# Patient Record
Sex: Male | Born: 2000 | Race: White | Hispanic: No | Marital: Single | Smoking: Never smoker
Health system: Southern US, Community
[De-identification: ages and names within clinical notes are randomized; demographics above are authoritative.]

## PROBLEM LIST (undated history)

## (undated) HISTORY — PX: OTHER SURGICAL HISTORY: SHX169

## (undated) HISTORY — PX: WISDOM TOOTH EXTRACTION: SHX21

---
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
## 2021-02-07 ENCOUNTER — Ambulatory Visit: Payer: Medicaid Other | Admitting: Dermatology

## 2022-01-08 ENCOUNTER — Ambulatory Visit (INDEPENDENT_AMBULATORY_CARE_PROVIDER_SITE_OTHER): Payer: Medicaid Other | Admitting: Internal Medicine

## 2022-01-08 ENCOUNTER — Encounter: Payer: Self-pay | Admitting: Internal Medicine

## 2022-01-08 DIAGNOSIS — F845 Asperger's syndrome: Secondary | ICD-10-CM | POA: Diagnosis not present

## 2022-01-08 DIAGNOSIS — K219 Gastro-esophageal reflux disease without esophagitis: Secondary | ICD-10-CM

## 2022-01-08 DIAGNOSIS — F419 Anxiety disorder, unspecified: Secondary | ICD-10-CM

## 2022-01-08 DIAGNOSIS — F5104 Psychophysiologic insomnia: Secondary | ICD-10-CM | POA: Diagnosis not present

## 2022-01-08 DIAGNOSIS — F902 Attention-deficit hyperactivity disorder, combined type: Secondary | ICD-10-CM

## 2022-01-08 DIAGNOSIS — G4700 Insomnia, unspecified: Secondary | ICD-10-CM | POA: Insufficient documentation

## 2022-01-08 DIAGNOSIS — F32A Depression, unspecified: Secondary | ICD-10-CM

## 2022-01-08 NOTE — Patient Instructions (Signed)
Attention Deficit Hyperactivity Disorder, Adult Attention deficit hyperactivity disorder (ADHD) is a mental health disorder that starts during childhood. For many people with ADHD, the disorder continues into the adult years. Treatment can help you manage your symptoms. There are three main types of ADHD: Inattentive. With this type, adults have difficulty paying attention. This may affect cognitive abilities. Hyperactive-impulsive. With this type, adults have a lot of energy and have difficulty controlling their behavior. Combination type. Some people may have symptoms of both types. What are the causes? The exact cause of ADHD is not known. Most experts believe a person's genes and environment possibly contribute to ADHD. What increases the risk? The following factors may make you more likely to develop this condition: Having a first-degree relative such as a parent, brother, or sister, with the condition. Being born before 37 weeks of pregnancy (prematurely) or at a low birth weight. Being born to a mother who smoked tobacco or drank alcohol during pregnancy. Having experienced a brain injury. Being exposed to lead or other toxins in the womb or early in life. What are the signs or symptoms? Symptoms of this condition depend on the type of ADHD. Symptoms of the inattentive type include: Difficulty paying attention or following instructions. Often making simple mistakes. Being disorganized. Avoiding tasks that require time and attention. Losing and forgetting things. Symptoms of the hyperactive-impulsive type include: Restlessness. Talking out of turn, interrupting others, or talking too much. Difficulty with: Sitting still. Feeling motivated. Relaxing. Waiting in line or waiting for a turn. People with the combination type have symptoms of both of the other types. In adults, this condition may lead to certain problems, such as: Keeping jobs. Performing tasks at work. Having  stable relationships. Being on time or keeping to a schedule. How is this diagnosed? This condition is diagnosed based on your current symptoms and your history of symptoms. The diagnosis can be made by a health care provider such as a primary care provider or a mental health care specialist. Your health care provider may use a symptom checklist or a behavior rating scale to evaluate your symptoms. Your health care provider may also want to talk with people who have observed your behaviors throughout your life. How is this treated? This condition can be treated with medicines and behavior therapy. Medicines may be the best option to reduce impulsive behaviors and improve attention. Your health care provider may recommend: Stimulant medicines. These are the most common medicines used for adult ADHD. They affect certain chemicals in the brain (neurotransmitters) and improve your ability to control your symptoms. A non-stimulant medicine. These medicines can also improve focus, attention, and impulsive behavior. It may take weeks to months to see the effects of this medicine. Counseling and behavioral management are also important for treating ADHD. Counseling is often used along with medicine. Your health care provider may suggest: Cognitive behavioral therapy (CBT). This type of therapy teaches you to replace negative thoughts and actions with positive thoughts and actions. When used as part of ADHD treatment, this therapy may also include: Coping strategies for organization, time management, impulse control, and stress reduction. Mindfulness and meditation training. Behavioral management. You may work with a coach who is specially trained to help people with ADHD manage and organize activities and function more effectively. Follow these instructions at home: Medicines  Take over-the-counter and prescription medicines only as told by your health care provider. Talk with your health care provider  about the possible side effects of your medicines and   how to manage them. Alcohol use Do not drink alcohol if: Your health care provider tells you not to drink. You are pregnant, may be pregnant, or are planning to become pregnant. If you drink alcohol: Limit how much you use to: 0-1 drink a day for women. 0-2 drinks a day for men. Know how much alcohol is in your drink. In the [place] one drink equals one 12 oz bottle of beer (355 mL), one 5 oz glass of wine (148 mL), or one 1 oz glass of hard liquor (44 mL). Lifestyle  Do not use illegal drugs. Get enough sleep. Eat a healthy diet. Exercise regularly. Exercise can help to reduce stress and anxiety. General instructions Learn as much as you can about adult ADHD, and work closely with your health care providers to find the treatments that work best for you. Follow the same schedule each day. Use reminder devices like notes, calendars, and phone apps to stay on time and organized. Keep all follow-up visits. Your health care provider will need to monitor your condition and adjust your treatment over time. Where to find more information A health care provider may be able to recommend resources that are available online or over the phone. You could start with: Attention Deficit Disorder Association (ADDA): add.org National Institute of Mental Health (NIMH): nimh.nih.gov Contact a health care provider if: Your symptoms continue to cause problems. You have side effects from your medicine, such as: Repeated muscle twitches, coughing, or speech outbursts. Sleep problems. Loss of appetite. Dizziness. Unusually fast heartbeat. Stomach pains. Headaches. You are struggling with anxiety, depression, or substance abuse. Get help right away if: You have a severe reaction to a medicine. This symptom may be an emergency. Get help right away. Call 911. Do not wait to see if the symptom will go away. Do not drive yourself to the hospital. Take  one of these steps if you feel like you may hurt yourself or others, or have thoughts about taking your own life: Go to your nearest emergency room. Call 911. Call the National Suicide Prevention Lifeline at 1-800-273-8255 or 988. This is open 24 hours a day Text the Crisis Text Line at 741741. Summary ADHD is a mental health disorder that starts during childhood and often continues into your adult years. The exact cause of ADHD is not known. Most experts believe genetics and environmental factors contribute to ADHD. There is no cure for ADHD, but treatment with medicine, cognitive behavioral therapy, or behavioral management can help you manage your condition. This information is not intended to replace advice given to you by your health care provider. Make sure you discuss any questions you have with your health care provider. Document Revised: 07/14/2021 Document Reviewed: 07/14/2021 Elsevier Patient Education  2023 Elsevier Inc.  

## 2022-01-08 NOTE — Assessment & Plan Note (Signed)
Continue Mirtazapine and Clonidine, managed by psychiatry, support offered

## 2022-01-08 NOTE — Assessment & Plan Note (Signed)
Continue clonidine and mirtazapine as prescribed by psychiatry

## 2022-01-08 NOTE — Assessment & Plan Note (Signed)
Continue Famotidine as needed 

## 2022-01-08 NOTE — Assessment & Plan Note (Signed)
Not medicated, follows with psychiatry

## 2022-01-08 NOTE — Progress Notes (Signed)
HPI  Patient presents to clinic today to establish care.  ADHD: He reports he grew out of this. He is not currently taking any medication for this. He is seeing Neuropsychiatry in [place].  GERD: He is not sure what triggers this. He takes Famotidine as needed. T here is no upper GI on file.  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 He is about to start a new job on Monday at [employer]. He is able to drive.  Insomnia: He has difficulty falling asleep. He takes Clonidine and Mirtazapine. He follows with psychiatry.  Anxiety and Depression: Managed with Clonidine and Mirtazapine. He is not currently seeing a therapist but is seeing psychiatry. He denies SI/HI.  Past Medical History:  Diagnosis Date   ADHD (attention deficit hyperactivity disorder), combined type 08/11/2014    Current Outpatient Medications  Medication Sig Dispense Refill   cetirizine (ZYRTEC) 10 MG tablet Take by mouth.     cloNIDine (CATAPRES) 0.2 MG tablet Take by mouth.     famotidine (PEPCID) 20 MG tablet Take 1 tablet (20 mg total) by mouth daily. 30 tablet 1   lidocaine (XYLOCAINE) 2 % solution Use as directed 5 mLs in the mouth or throat every 6 (six) hours as needed for mouth pain. Oral swish and swallow 4 times a day for 5 days for meals. 100 mL 0   mirtazapine (REMERON) 15 MG tablet Take by mouth.     No current facility-administered medications for this visit.    Allergies  Allergen Reactions   Abilify [Aripiprazole] Shortness Of Breath and Swelling    Family History  Problem Relation Age of Onset   Bladder Cancer Neg Hx    Kidney disease Neg Hx    Prostate cancer Neg Hx     Social History   Socioeconomic History   Marital status: Single    Spouse name: Not on file   Number of children: Not on file   Years of education: Not on file   Highest education level: Not on file  Occupational History   Not on file  Tobacco Use   Smoking status: Never   Smokeless  tobacco: Never  Substance and Sexual Activity   Alcohol use: Never   Drug use: Not on file   Sexual activity: Not on file  Other Topics Concern   Not on file  Social History Narrative   Not on file   Social Determinants of Health   Financial Resource Strain: Not on file  Food Insecurity: Not on file  Transportation Needs: Not on file  Physical Activity: Not on file  Stress: Not on file  Social Connections: Not on file  Intimate Partner Violence: Not on file    ROS:  Constitutional: Denies fever, malaise, fatigue, headache or abrupt weight changes.  HEENT: Denies eye pain, eye redness, ear pain, ringing in the ears, wax buildup, runny nose, nasal congestion, bloody nose, or sore throat. Respiratory: Denies difficulty breathing, shortness of breath, cough or sputum production.   Cardiovascular: Denies chest pain, chest tightness, palpitations or swelling in the hands or feet.  Gastrointestinal: Pt reports intermittent reflux. Denies abdominal pain, bloating, constipation, diarrhea or blood in the stool.  GU: Denies frequency, urgency, pain with urination, blood in urine, odor or discharge. Musculoskeletal: Denies decrease in range of motion, difficulty with gait, muscle pain or joint pain and swelling.  Skin: Denies redness, rashes, lesions or ulcercations.  Neurological: Pt reports insomnia. Denies dizziness, difficulty with memory, difficulty  with speech or problems with balance and coordination.  Psych: Pt has a history of anxiety and depression. Denies SI/HI.  No other specific complaints in a complete review of systems (except as listed in HPI above).  PE: BP 128/84 (BP Location: Left Arm, Patient Position: Sitting, Cuff Size: Normal)   Pulse 65   Temp (!) 97.1 F (36.2 C) (Temporal)   Ht 5' 10.5" (1.791 m)   Wt 131 lb (59.4 kg)   SpO2 96%   BMI 18.53 kg/m   Wt Readings from Last 3 Encounters:  11/10/19 120 lb (54.4 kg) (4 %, Z= -1.78)*  06/26/19 120 lb (54.4 kg)  (4 %, Z= -1.72)*   * Growth percentiles are based on CDC (Boys, 2-20 Years) data.    General: Appears his stated age, well developed, well nourished in NAD. HEENT: Head: normal shape and size; Eyes: sclera white, no icterus, conjunctiva pink, PERRLA and EOMs intact;  Neck: Neck supple, trachea midline. No masses, lumps or thyromegaly present.  Cardiovascular: Normal rate and rhythm. S1,S2 noted.  No murmur, rubs or gallops noted. No JVD or BLE edema. Pulmonary/Chest: Normal effort and positive vesicular breath sounds. No respiratory distress. No wheezes, rales or ronchi noted.  Abdomen: Normal bowel sounds. Musculoskeletal: No difficulty with gait.  Neurological: Alert and oriented.  Psychiatric: Judgment and thought content normal.     Assessment and Plan:  RTC in 6 months for your annual exam Nicki Reaper, NP

## 2022-01-08 NOTE — Assessment & Plan Note (Signed)
He is trying to be independent

## 2022-07-10 ENCOUNTER — Ambulatory Visit: Payer: Medicaid Other | Admitting: Internal Medicine

## 2022-07-16 ENCOUNTER — Ambulatory Visit (INDEPENDENT_AMBULATORY_CARE_PROVIDER_SITE_OTHER): Payer: Medicaid Other | Admitting: Internal Medicine

## 2022-07-16 ENCOUNTER — Encounter: Payer: Self-pay | Admitting: Internal Medicine

## 2022-07-16 VITALS — BP 114/62 | HR 95 | Temp 97.1°F | Ht 70.5 in | Wt 138.0 lb

## 2022-07-16 DIAGNOSIS — Z23 Encounter for immunization: Secondary | ICD-10-CM | POA: Diagnosis not present

## 2022-07-16 DIAGNOSIS — Z0001 Encounter for general adult medical examination with abnormal findings: Secondary | ICD-10-CM | POA: Diagnosis not present

## 2022-07-16 NOTE — Patient Instructions (Signed)
Preventive Care 18-21 Years Old, Male ?Preventive care refers to lifestyle choices and visits with your health care provider that can promote health and wellness. At this stage in your life, you may start seeing a primary care physician instead of a pediatrician for your preventive care. Preventive care visits are also called wellness exams. ?What can I expect for my preventive care visit? ?Counseling ?During your preventive care visit, your health care provider may ask about your: ?Medical history, including: ?Past medical problems. ?Family medical history. ?Current health, including: ?Home life and relationship well-being. ?Emotional well-being. ?Sexual activity and sexual health. ?Lifestyle, including: ?Alcohol, nicotine or tobacco, and drug use. ?Access to firearms. ?Diet, exercise, and sleep habits. ?Sunscreen use. ?Motor vehicle safety. ?Physical exam ?Your health care provider may check your: ?Height and weight. These may be used to calculate your BMI (body mass index). BMI is a measurement that tells if you are at a healthy weight. ?Waist circumference. This measures the distance around your waistline. This measurement also tells if you are at a healthy weight and may help predict your risk of certain diseases, such as type 2 diabetes and high blood pressure. ?Heart rate and blood pressure. ?Body temperature. ?Skin for abnormal spots. ?What immunizations do I need? ? ?Vaccines are usually given at various ages, according to a schedule. Your health care provider will recommend vaccines for you based on your age, medical history, and lifestyle or other factors, such as travel or where you work. ?What tests do I need? ?Screening ?Your health care provider may recommend screening tests for certain conditions. This may include: ?Vision and hearing tests. ?Lipid and cholesterol levels. ?Hepatitis B test. ?Hepatitis C test. ?HIV (human immunodeficiency virus) test. ?STI (sexually transmitted infection) testing, if  you are at risk. ?Tuberculosis skin test. ?Talk with your health care provider about your test results, treatment options, and if necessary, the need for more tests. ?Follow these instructions at home: ?Eating and drinking ? ?Eat a healthy diet that includes fresh fruits and vegetables, whole grains, lean protein, and low-fat dairy products. ?Drink enough fluid to keep your urine pale yellow. ?Do not drink alcohol if: ?Your health care provider tells you not to drink. ?You are under the legal drinking age. In the [place] the legal drinking age is 21. ?If you drink alcohol: ?Limit how much you have to 0-2 drinks a day. ?Know how much alcohol is in your drink. In the [place] one drink equals one 12 oz bottle of beer (355 mL), one 5 oz glass of wine (148 mL), or one 1? oz glass of hard liquor (44 mL). ?Lifestyle ?Brush your teeth every morning and night with fluoride toothpaste. Floss one time each day. ?Exercise for at least 30 minutes 5 or more days of the week. ?Do not use any products that contain nicotine or tobacco. These products include cigarettes, chewing tobacco, and vaping devices, such as e-cigarettes. If you need help quitting, ask your health care provider. ?Do not use drugs. ?If you are sexually active, practice safe sex. Use a condom or other form of protection to prevent STIs. ?Find healthy ways to manage stress, such as: ?Meditation, yoga, or listening to music. ?Journaling. ?Talking to a trusted person. ?Spending time with friends and family. ?Safety ?Always wear your seat belt while driving or riding in a vehicle. ?Do not drive: ?If you have been drinking alcohol. Do not ride with someone who has been drinking. ?When you are tired or distracted. ?While texting. ?If you have been using   any mind-altering substances or drugs. ?Wear a helmet and other protective equipment during sports activities. ?If you have firearms in your house, make sure you follow all gun safety procedures. ?Seek help if you have  been bullied, physically abused, or sexually abused. ?Use the internet responsibly to avoid dangers, such as online bullying and online sex predators. ?What's next? ?Go to your health care provider once a year for an annual wellness visit. ?Ask your health care provider how often you should have your eyes and teeth checked. ?Stay up to date on all vaccines. ?This information is not intended to replace advice given to you by your health care provider. Make sure you discuss any questions you have with your health care provider. ?Document Revised: 09/21/2020 Document Reviewed: 09/21/2020 ?Elsevier Patient Education ? 2023 [employer] ? ?

## 2022-07-16 NOTE — Progress Notes (Unsigned)
Subjective:    Patient ID: Mark Gill, male    DOB: September 20, 2000, 22 y.o.   MRN: [id number]  HPI  Patient presents to clinic today for his annual exam.  Flu: never Tetanus: > 10 years ago COVID: never Dentist: as needed  Diet: He does eat meat. He consumes more veggies than fruits. He does eat fried foods. He drinks mostly water, dt soda. Exercise: None  Review of Systems     Past Medical History:  Diagnosis Date   ADHD (attention deficit hyperactivity disorder), combined type 08/11/2014    Current Outpatient Medications  Medication Sig Dispense Refill   cetirizine (ZYRTEC) 10 MG tablet Take by mouth.     cloNIDine (CATAPRES) 0.2 MG tablet Take by mouth.     famotidine (PEPCID) 20 MG tablet Take 1 tablet (20 mg total) by mouth daily. 30 tablet 1   mirtazapine (REMERON) 15 MG tablet Take by mouth.     Multiple Vitamin (MULTI-VITAMIN DAILY PO) Take by mouth.     No current facility-administered medications for this visit.    Allergies  Allergen Reactions   Abilify [Aripiprazole] Shortness Of Breath and Swelling    Family History  Problem Relation Age of Onset   Drug abuse Mother    Ovarian cancer Mother    ADD / ADHD Father    Cancer Maternal Grandmother    Heart disease Paternal Grandmother    Congestive Heart Failure Paternal Grandfather    Bladder Cancer Neg Hx    Kidney disease Neg Hx    Prostate cancer Neg Hx     Social History   Socioeconomic History   Marital status: Single    Spouse name: Not on file   Number of children: Not on file   Years of education: Not on file   Highest education level: Not on file  Occupational History   Not on file  Tobacco Use   Smoking status: Never   Smokeless tobacco: Never  Vaping Use   Vaping Use: Never used  Substance and Sexual Activity   Alcohol use: Never   Drug use: Not on file   Sexual activity: Not on file  Other Topics Concern   Not on file  Social History Narrative   Not on file   Social  Determinants of Health   Financial Resource Strain: Not on file  Food Insecurity: Not on file  Transportation Needs: Not on file  Physical Activity: Not on file  Stress: Not on file  Social Connections: Not on file  Intimate Partner Violence: Not on file     Constitutional: Denies fever, malaise, fatigue, headache or abrupt weight changes.  HEENT: Denies eye pain, eye redness, ear pain, ringing in the ears, wax buildup, runny nose, nasal congestion, bloody nose, or sore throat. Respiratory: Denies difficulty breathing, shortness of breath, cough or sputum production.   Cardiovascular: Denies chest pain, chest tightness, palpitations or swelling in the hands or feet.  Gastrointestinal: Patient reports intermittent reflux.  Denies abdominal pain, bloating, constipation, diarrhea or blood in the stool.  GU: Denies urgency, frequency, pain with urination, burning sensation, blood in urine, odor or discharge. Musculoskeletal: Denies decrease in range of motion, difficulty with gait, muscle pain or joint pain and swelling.  Skin: Denies redness, rashes, lesions or ulcercations.  Neurological: Patient reports inattention, insomnia.  Denies dizziness, difficulty with memory, difficulty with speech or problems with balance and coordination.  Psych: Patient has a history of anxiety and depression.  Denies SI/HI.  No  other specific complaints in a complete review of systems (except as listed in HPI above).  Objective:   Physical Exam BP 114/62 (BP Location: Left Arm, Patient Position: Sitting, Cuff Size: Normal)   Pulse 95   Temp (!) 97.1 F (36.2 C) (Temporal)   Ht 5' 10.5" (1.791 m)   Wt 138 lb (62.6 kg)   SpO2 98%   BMI 19.52 kg/m   Wt Readings from Last 3 Encounters:  01/08/22 131 lb (59.4 kg)  11/10/19 120 lb (54.4 kg) (4 %, Z= -1.78)*  06/26/19 120 lb (54.4 kg) (4 %, Z= -1.72)*   * Growth percentiles are based on CDC (Boys, 2-20 Years) data.    General: Appears his stated age,  well developed, well nourished in NAD. Skin: Warm, dry and intact. No rashes noted. HEENT: Head: normal shape and size; Eyes: sclera white, no icterus, conjunctiva pink, PERRLA and EOMs intact;  Neck:  Neck supple, trachea midline. No masses, lumps or thyromegaly present.  Cardiovascular: Normal rate and rhythm. S1,S2 noted.  No murmur, rubs or gallops noted. No JVD or BLE edema.  Pulmonary/Chest: Normal effort and positive vesicular breath sounds. No respiratory distress. No wheezes, rales or ronchi noted.  Abdomen:  Normal bowel sounds.  Musculoskeletal: Strength 5/5 BUE/BLE. No difficulty with gait.  Neurological: Alert and oriented. Cranial nerves II-XII grossly intact. Coordination normal.  Psychiatric: Mood and affect normal. Behavior is normal. Judgment and thought content normal.         Assessment & Plan:   Preventative Health Maintenance:  Encouraged him to get a flu shot in the fall Tetanus today Encouraged him to get his COVID-vaccine Encouraged him to consume a balanced diet and exercise regimen Advised him to see an eye doctor and dentist annually He declines labs today  RTC in 6 months, follow-up chronic conditions Nicki Reaper, NP

## 2023-01-15 ENCOUNTER — Encounter: Payer: Self-pay | Admitting: Internal Medicine

## 2023-01-15 ENCOUNTER — Ambulatory Visit: Payer: MEDICAID | Admitting: Internal Medicine

## 2023-01-15 VITALS — BP 104/70 | HR 88 | Temp 96.8°F | Wt 135.0 lb

## 2023-01-15 DIAGNOSIS — F5104 Psychophysiologic insomnia: Secondary | ICD-10-CM

## 2023-01-15 DIAGNOSIS — F32A Depression, unspecified: Secondary | ICD-10-CM

## 2023-01-15 DIAGNOSIS — F902 Attention-deficit hyperactivity disorder, combined type: Secondary | ICD-10-CM | POA: Diagnosis not present

## 2023-01-15 DIAGNOSIS — F419 Anxiety disorder, unspecified: Secondary | ICD-10-CM

## 2023-01-15 DIAGNOSIS — K219 Gastro-esophageal reflux disease without esophagitis: Secondary | ICD-10-CM | POA: Diagnosis not present

## 2023-01-15 DIAGNOSIS — F845 Asperger's syndrome: Secondary | ICD-10-CM

## 2023-01-15 NOTE — Assessment & Plan Note (Signed)
Continue clonidine and mirtazapine as prescribed by psychiatry

## 2023-01-15 NOTE — Assessment & Plan Note (Signed)
Avoid foods that trigger reflux Continue famotidine as needed

## 2023-01-15 NOTE — Assessment & Plan Note (Signed)
Not medicated We will monitor 

## 2023-01-15 NOTE — Assessment & Plan Note (Signed)
Follows with neuropsychiatry

## 2023-01-15 NOTE — Progress Notes (Signed)
Subjective:    Patient ID: Norval Morton, male    DOB: 06-01-2000, 22 y.o.   MRN: [id number]  HPI  Patient presents to clinic today for follow-up of chronic conditions.  ADHD: He currently denies any issues at this time.  He is not currently taking any medications for this.  He follows with neuropsychiatry.  GERD: Triggered by spicy foods.  He takes famotidine as needed with good relief.  There is no upper GI on file.  Aspergers: He lives with his grandmother and his aunt.  He is working and able to drive.  He follows with neuropsychiatry.  Insomnia: He has difficulty falling asleep.  He takes clonidine and mirtazapine prescribed by psychiatry.  There is no sleep study on file.  Anxiety and depression: Managed with clonidine and mirtazapine.  He is not currently seeing a therapist but follows with psychiatry.  He denies SI/HI.  Review of Systems  Past Medical History:  Diagnosis Date   ADHD (attention deficit hyperactivity disorder), combined type 08/11/2014    Current Outpatient Medications  Medication Sig Dispense Refill   cetirizine (ZYRTEC) 10 MG tablet Take by mouth.     cloNIDine (CATAPRES) 0.2 MG tablet Take by mouth.     famotidine (PEPCID) 20 MG tablet Take 1 tablet (20 mg total) by mouth daily. 30 tablet 1   mirtazapine (REMERON) 15 MG tablet Take by mouth.     Multiple Vitamin (MULTI-VITAMIN DAILY PO) Take by mouth.     No current facility-administered medications for this visit.    Allergies  Allergen Reactions   Abilify [Aripiprazole] Shortness Of Breath and Swelling    Family History  Problem Relation Age of Onset   Drug abuse Mother    Ovarian cancer Mother    ADD / ADHD Father    Cancer Maternal Grandmother    Heart disease Paternal Grandmother    Congestive Heart Failure Paternal Grandfather    Bladder Cancer Neg Hx    Kidney disease Neg Hx    Prostate cancer Neg Hx     Social History   Socioeconomic History   Marital status: Single     Spouse name: Not on file   Number of children: Not on file   Years of education: Not on file   Highest education level: Not on file  Occupational History   Not on file  Tobacco Use   Smoking status: Never   Smokeless tobacco: Never  Vaping Use   Vaping status: Never Used  Substance and Sexual Activity   Alcohol use: Never   Drug use: Not on file   Sexual activity: Not on file  Other Topics Concern   Not on file  Social History Narrative   Not on file   Social Determinants of Health   Financial Resource Strain: Not on file  Food Insecurity: Not on file  Transportation Needs: Not on file  Physical Activity: Not on file  Stress: Not on file  Social Connections: Not on file  Intimate Partner Violence: Not on file     Constitutional: Denies fever, malaise, fatigue, headache or abrupt weight changes.  HEENT: Denies eye pain, eye redness, ear pain, ringing in the ears, wax buildup, runny nose, nasal congestion, bloody nose, or sore throat. Respiratory: Denies difficulty breathing, shortness of breath, cough or sputum production.   Cardiovascular: Denies chest pain, chest tightness, palpitations or swelling in the hands or feet.  Gastrointestinal: Patient reports intermittent reflux.  Denies abdominal pain, bloating, constipation, diarrhea or blood  in the stool.  GU: Denies urgency, frequency, pain with urination, burning sensation, blood in urine, odor or discharge. Musculoskeletal: Denies decrease in range of motion, difficulty with gait, muscle pain or joint pain and swelling.  Skin: Denies redness, rashes, lesions or ulcercations.  Neurological: Patient reports insomnia.  Denies dizziness, difficulty with memory, difficulty with speech or problems with balance and coordination.  Psych: Patient has a history of anxiety and depression.  Denies SI/HI.  No other specific complaints in a complete review of systems (except as listed in HPI above).     Objective:   Physical  Exam BP 104/70 (BP Location: Left Arm, Patient Position: Sitting, Cuff Size: Normal)   Pulse 88   Temp (!) 96.8 F (36 C) (Temporal)   Wt 135 lb (61.2 kg)   SpO2 96%   BMI 19.10 kg/m   Wt Readings from Last 3 Encounters:  07/16/22 138 lb (62.6 kg)  01/08/22 131 lb (59.4 kg)  11/10/19 120 lb (54.4 kg) (4%, Z= -1.78)*   * Growth percentiles are based on CDC (Boys, 2-20 Years) data.    General: Appears his stated age, well developed, well nourished in NAD. Skin: Warm, dry and intact.  HEENT: Head: normal shape and size; Eyes: sclera white, no icterus, conjunctiva pink, PERRLA and EOMs intact;  Cardiovascular: Normal rate and rhythm. S1,S2 noted.  No murmur, rubs or gallops noted. No JVD or BLE edema.  Pulmonary/Chest: Normal effort and positive vesicular breath sounds. No respiratory distress. No wheezes, rales or ronchi noted.  Abdomen: Soft and nontender. Normal bowel sounds.  Musculoskeletal: No difficulty with gait.  Neurological: Alert and oriented. Coordination normal.  Psychiatric: Mood and affect normal. Behavior is normal. Judgment and thought content normal.      Assessment & Plan:      RTC in 6 months for your annual exam Nicki Reaper, NP

## 2023-07-18 ENCOUNTER — Encounter: Payer: MEDICAID | Admitting: Internal Medicine

## 2023-07-18 NOTE — Progress Notes (Deleted)
 Subjective:    Patient ID: Mark Gill, male    DOB: December 19, 2000, 23 y.o.   MRN: [id number]  HPI  Patient presents to clinic today for his annual exam.  Flu: never Tetanus: 07/2022 COVID: never Dentist: as needed  Diet: He does eat meat. He consumes more veggies than fruits. He does eat fried foods. He drinks mostly water, dt soda. Exercise: None  Review of Systems     Past Medical History:  Diagnosis Date   ADHD (attention deficit hyperactivity disorder), combined type 08/11/2014    Current Outpatient Medications  Medication Sig Dispense Refill   cetirizine (ZYRTEC) 10 MG tablet Take by mouth.     cloNIDine (CATAPRES) 0.2 MG tablet Take by mouth.     famotidine (PEPCID) 20 MG tablet Take 1 tablet (20 mg total) by mouth daily. 30 tablet 1   mirtazapine (REMERON) 15 MG tablet Take by mouth.     Multiple Vitamin (MULTI-VITAMIN DAILY PO) Take by mouth.     No current facility-administered medications for this visit.    Allergies  Allergen Reactions   Abilify [Aripiprazole] Shortness Of Breath and Swelling    Family History  Problem Relation Age of Onset   Drug abuse Mother    Ovarian cancer Mother    ADD / ADHD Father    Cancer Maternal Grandmother    Heart disease Paternal Grandmother    Congestive Heart Failure Paternal Grandfather    Bladder Cancer Neg Hx    Kidney disease Neg Hx    Prostate cancer Neg Hx     Social History   Socioeconomic History   Marital status: Single    Spouse name: Not on file   Number of children: Not on file   Years of education: Not on file   Highest education level: Not on file  Occupational History   Not on file  Tobacco Use   Smoking status: Never   Smokeless tobacco: Never  Vaping Use   Vaping status: Never Used  Substance and Sexual Activity   Alcohol use: Never   Drug use: Not on file   Sexual activity: Not on file  Other Topics Concern   Not on file  Social History Narrative   Not on file   Social Drivers  of Health   Financial Resource Strain: Not on file  Food Insecurity: Not on file  Transportation Needs: Not on file  Physical Activity: Not on file  Stress: Not on file  Social Connections: Not on file  Intimate Partner Violence: Not on file     Constitutional: Denies fever, malaise, fatigue, headache or abrupt weight changes.  HEENT: Denies eye pain, eye redness, ear pain, ringing in the ears, wax buildup, runny nose, nasal congestion, bloody nose, or sore throat. Respiratory: Denies difficulty breathing, shortness of breath, cough or sputum production.   Cardiovascular: Denies chest pain, chest tightness, palpitations or swelling in the hands or feet.  Gastrointestinal: Patient reports intermittent reflux.  Denies abdominal pain, bloating, constipation, diarrhea or blood in the stool.  GU: Denies urgency, frequency, pain with urination, burning sensation, blood in urine, odor or discharge. Musculoskeletal: Denies decrease in range of motion, difficulty with gait, muscle pain or joint pain and swelling.  Skin: Denies redness, rashes, lesions or ulcercations.  Neurological: Patient reports inattention, insomnia.  Denies dizziness, difficulty with memory, difficulty with speech or problems with balance and coordination.  Psych: Patient has a history of anxiety and depression.  Denies SI/HI.  No other specific complaints  in a complete review of systems (except as listed in HPI above).  Objective:   Physical Exam There were no vitals taken for this visit.  Wt Readings from Last 3 Encounters:  01/15/23 135 lb (61.2 kg)  07/16/22 138 lb (62.6 kg)  01/08/22 131 lb (59.4 kg)    General: Appears his stated age, well developed, well nourished in NAD. Skin: Warm, dry and intact. No rashes noted. HEENT: Head: normal shape and size; Eyes: sclera white, no icterus, conjunctiva pink, PERRLA and EOMs intact;  Neck:  Neck supple, trachea midline. No masses, lumps or thyromegaly present.   Cardiovascular: Normal rate and rhythm. S1,S2 noted.  No murmur, rubs or gallops noted. No JVD or BLE edema.  Pulmonary/Chest: Normal effort and positive vesicular breath sounds. No respiratory distress. No wheezes, rales or ronchi noted.  Abdomen:  Normal bowel sounds.  Musculoskeletal: Strength 5/5 BUE/BLE. No difficulty with gait.  Neurological: Alert and oriented. Cranial nerves II-XII grossly intact. Coordination normal.  Psychiatric: Mood and affect normal. Behavior is normal. Judgment and thought content normal.         Assessment & Plan:   Preventative Health Maintenance:  Encouraged him to get a flu shot in the fall Tetanus UTD Encouraged him to get his COVID-vaccine Encouraged him to consume a balanced diet and exercise regimen Advised him to see an eye doctor and dentist annually Will check CBC, c-Met, lipid, A1c, HIV and hep C today  RTC in 6 months, follow-up chronic conditions Nicki Reaper, NP
